# Patient Record
Sex: Male | Born: 1995 | Race: White | Hispanic: No | Marital: Single | Smoking: Never smoker
Health system: Southern US, Community
[De-identification: ages and names within clinical notes are randomized; demographics above are authoritative.]

---
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
## 2016-09-10 ENCOUNTER — Emergency Department: Payer: BLUE CROSS/BLUE SHIELD

## 2016-09-10 ENCOUNTER — Emergency Department
Admission: EM | Admit: 2016-09-10 | Discharge: 2016-09-10 | Disposition: A | Discharge status: Home / Self Care | Payer: BLUE CROSS/BLUE SHIELD | Attending: Emergency Medicine | Admitting: Emergency Medicine

## 2016-09-10 ENCOUNTER — Encounter: Payer: Self-pay | Admitting: *Deleted

## 2016-09-10 DIAGNOSIS — Y999 Unspecified external cause status: Secondary | ICD-10-CM | POA: Insufficient documentation

## 2016-09-10 DIAGNOSIS — Y929 Unspecified place or not applicable: Secondary | ICD-10-CM | POA: Insufficient documentation

## 2016-09-10 DIAGNOSIS — Y9389 Activity, other specified: Secondary | ICD-10-CM | POA: Diagnosis not present

## 2016-09-10 DIAGNOSIS — S62231A Other displaced fracture of base of first metacarpal bone, right hand, initial encounter for closed fracture: Secondary | ICD-10-CM

## 2016-09-10 DIAGNOSIS — W208XXA Other cause of strike by thrown, projected or falling object, initial encounter: Secondary | ICD-10-CM | POA: Diagnosis not present

## 2016-09-10 DIAGNOSIS — S62234A Other nondisplaced fracture of base of first metacarpal bone, right hand, initial encounter for closed fracture: Secondary | ICD-10-CM | POA: Diagnosis not present

## 2016-09-10 DIAGNOSIS — S6991XA Unspecified injury of right wrist, hand and finger(s), initial encounter: Secondary | ICD-10-CM | POA: Diagnosis present

## 2016-09-10 MED ORDER — OXYCODONE-ACETAMINOPHEN 5-325 MG PO TABS: 1.0000 | ORAL_TABLET | Freq: Four times a day (QID) | ORAL | 0 refills | Status: AC | PRN

## 2016-09-10 MED ORDER — IBUPROFEN 800 MG PO TABS: 800.0000 mg | ORAL_TABLET | Freq: Three times a day (TID) | ORAL | 0 refills | Status: AC | PRN

## 2016-09-10 NOTE — ED Notes (Signed)
Pt ambulatory to xray with steady gait noted.  

## 2016-09-10 NOTE — ED Notes (Signed)
Pt reports moving gym equipment this evening, lever coming down onto right thumb.  Pt with limited mobility of right hand, swelling noted at base of thumb.

## 2016-09-10 NOTE — Discharge Instructions (Signed)
Take pain medicine as directed. Continue ice for swelling and pain. Follow-up with the orthopedist next week for further evaluation.

## 2016-09-10 NOTE — ED Provider Notes (Signed)
[facility] Emergency Department Provider Note  ____________________________________________  Time seen: Approximately 9:52 PM  I have reviewed the triage vital signs and the nursing notes.   HISTORY  Chief Complaint Hand Pain    HPI Mathew Nguyen is a 20 y.o. male who dropped a heavy piece of equipment on his right thumb at the base.Marland Kitchen. Swelling and pain. He is right-handed.   No past medical history on file.  There are no active problems to display for this patient.   No past surgical history on file.  Current Outpatient Rx  . Order #: [id number] Class: Print  . Order #: [id number] Class: Print    Allergies Review of patient's allergies indicates no known allergies.  No family history on file.  Social History Social History  Substance Use Topics  . Smoking status: Never Smoker  . Smokeless tobacco: Never Used  . Alcohol use No    Review of Systems Constitutional: No fever/chills Eyes: No visual changes. ENT: No sore throat. Cardiovascular: Denies chest pain. Respiratory: Denies shortness of breath. Gastrointestinal: No abdominal pain.  No nausea, no vomiting.  No diarrhea.  No constipation. Genitourinary: Negative for dysuria. Musculoskeletal: Negative for back pain. Skin: Negative for rash. Neurological: Negative for headaches, focal weakness or numbness. 10-point ROS otherwise negative.  ____________________________________________   PHYSICAL EXAM:  VITAL SIGNS: ED Triage Vitals  Enc Vitals Group     BP 09/10/16 2014 138/74     Pulse Rate 09/10/16 2014 74     Resp 09/10/16 2014 18     Temp 09/10/16 2014 98.4 F (36.9 C)     Temp Source 09/10/16 2014 Oral     SpO2 09/10/16 2014 98 %     Weight 09/10/16 2016 163 lb (73.9 kg)     Height 09/10/16 2016 5\' 8"  (1.727 m)     Head Circumference --      Peak Flow --      Pain Score 09/10/16 2018 7     Pain Loc --      Pain Edu? --      Excl. in GC? --     Constitutional:  Alert and oriented. Well appearing and in no acute distress. Eyes: Conjunctivae are normal. Head: Atraumatic.  Neck:  Supple.   Cardiovascular: Normal rate, regular rhythm. Grossly normal heart sounds.  Good peripheral circulation. Respiratory: Normal respiratory effort.   Musculoskeletal: Nml ROM of upper and lower extremity joints;  Except right thumb with swelling at the base of the first and second medical carpal bone with tenderness. Motor and sensory intact. No open wound. Nontender over the wrist, and elbow. Neurologic:  Normal speech and language. No gross focal neurologic deficits are appreciated. No gait instability. Skin:  Skin is warm, dry and intact. No rash noted. Psychiatric: Mood and affect are normal. Speech and behavior are normal.  ____________________________________________   LABS (all labs ordered are listed, but only abnormal results are displayed)  Labs Reviewed - No data to display ____________________________________________  EKG   ____________________________________________  RADIOLOGY  CLINICAL DATA:  Dropped gym equipment on hand today. Thumb pain and swelling.  EXAM: RIGHT HAND - COMPLETE 3+ VIEW  COMPARISON:  RIGHT hand radiograph August 19, 2010  FINDINGS: Tiny bony fragment at base of first metacarpus with underlying suspected donor site. No dislocation. There is no evidence of arthropathy or other focal bone abnormality. Soft tissues are unremarkable.  IMPRESSION: Acute suspected tiny avulsion fracture base of first metacarpus.   Electronically Signed  By: Awilda Metroourtnay  Bloomer M.D.   On: 09/10/2016 20:52 ____________________________________________   PROCEDURES  Procedure(s) performed: None  Critical Care performed: No  ____________________________________________   INITIAL IMPRESSION / ASSESSMENT AND PLAN / ED COURSE  Pertinent labs & imaging results that were available during my care of the patient were reviewed  by me and considered in my medical decision making (see chart for details).  [age] year old who dropped a heavy object on his right thumb resulting in an avulsion fracture to the base of the first metacarpal bone. He is placed in a thumb spica splint, given pain medicine. Will follow-up with orthopedist next week. Continue ice for pain and swelling. ____________________________________________   FINAL CLINICAL IMPRESSION(S) / ED DIAGNOSES  Final diagnoses:  Closed nondisplaced fracture of base of first metacarpal bone of right hand, unspecified fracture morphology, initial encounter      Ignacia BayleyRobert Cree Kunert, PA-C 09/10/16 2155    Rockne MenghiniAnne-Caroline Norman, MD 09/10/16 2338

## 2016-09-10 NOTE — ED Triage Notes (Signed)
Pt has pain in right great thumb and hand.  Pt dropped gym equipment on hand today.  Swelling noted to thumb and hand. .Marland Kitchen

## 2016-11-15 IMAGING — CR DG HAND COMPLETE 3+V*R*
1 series · 3 of 3 positions shown · non-contrast
Comparison: RIGHT hand radiograph August 19, 2010

CLINICAL DATA: Dropped gym equipment on hand today. Thumb pain and
swelling.

EXAM:
RIGHT HAND - COMPLETE 3+ VIEW

[Series 1: x hand pa right · 0.14mm/px · 3 of 3 slices shown]
[im 1/3]
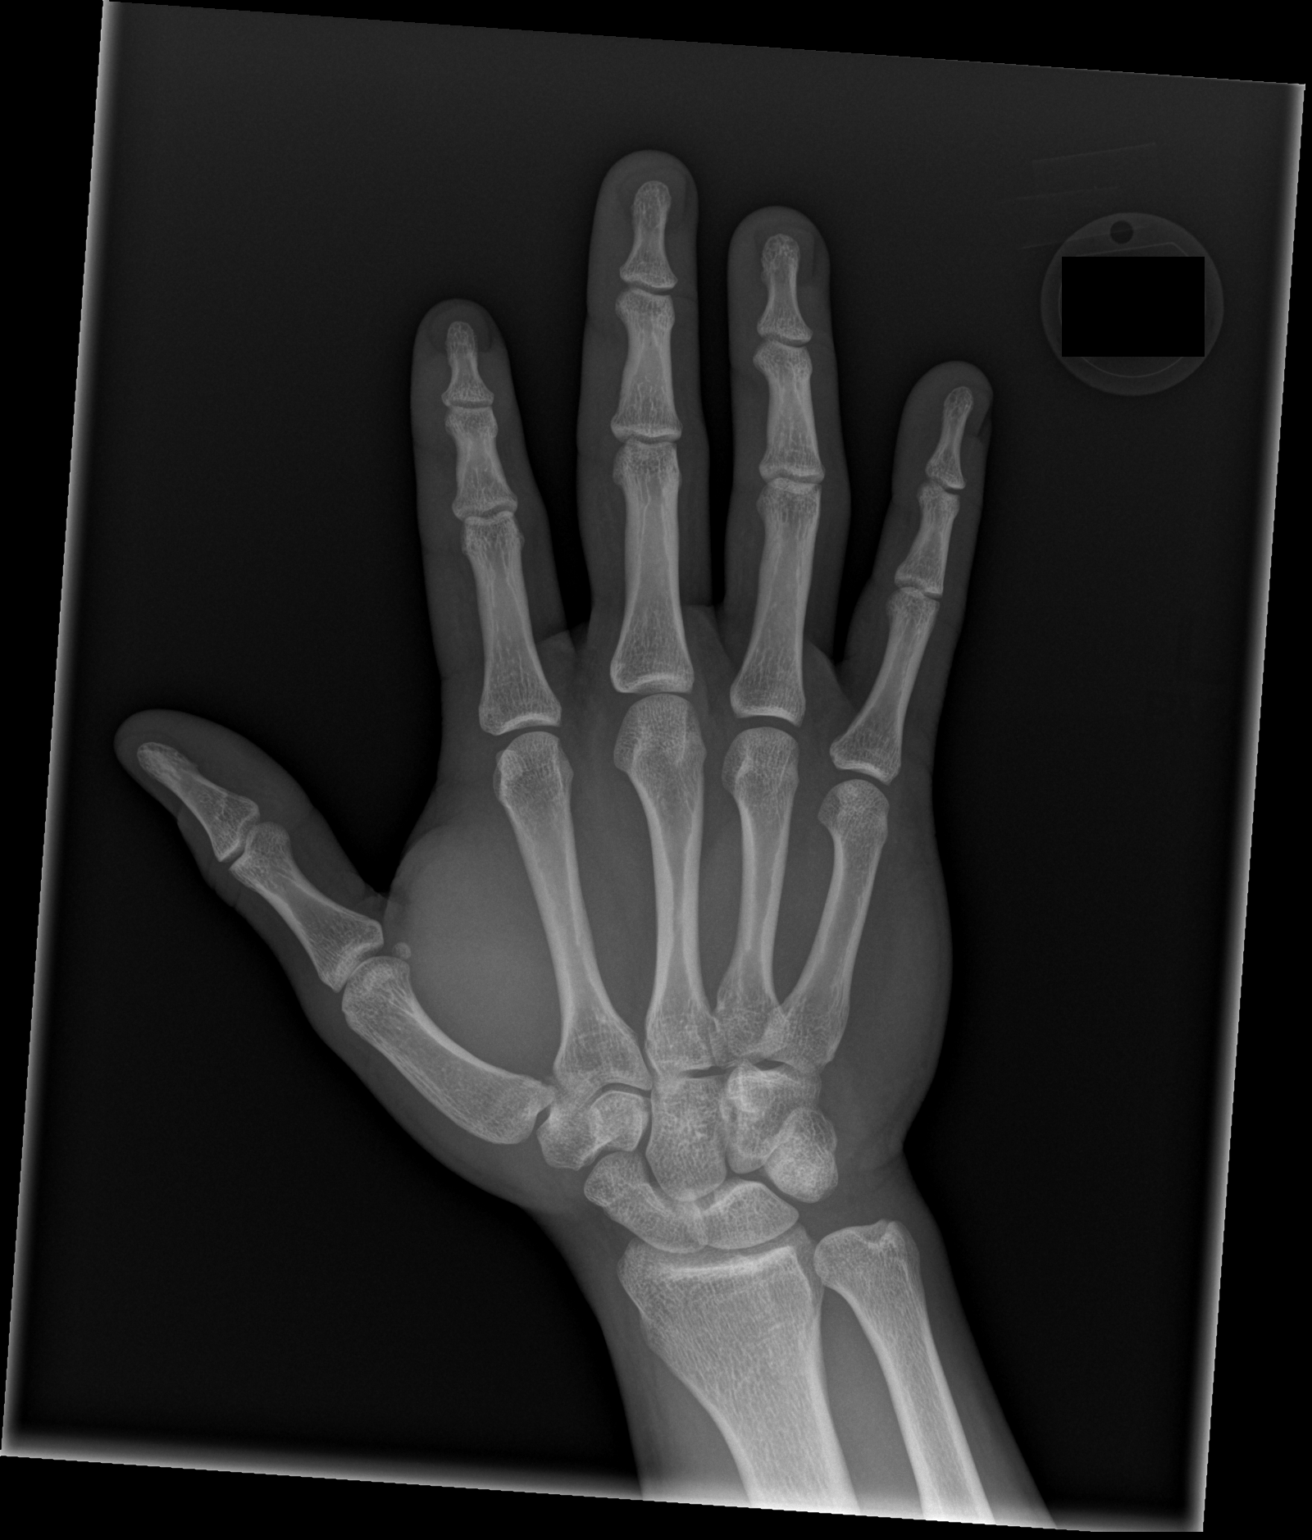
[im 2/3]
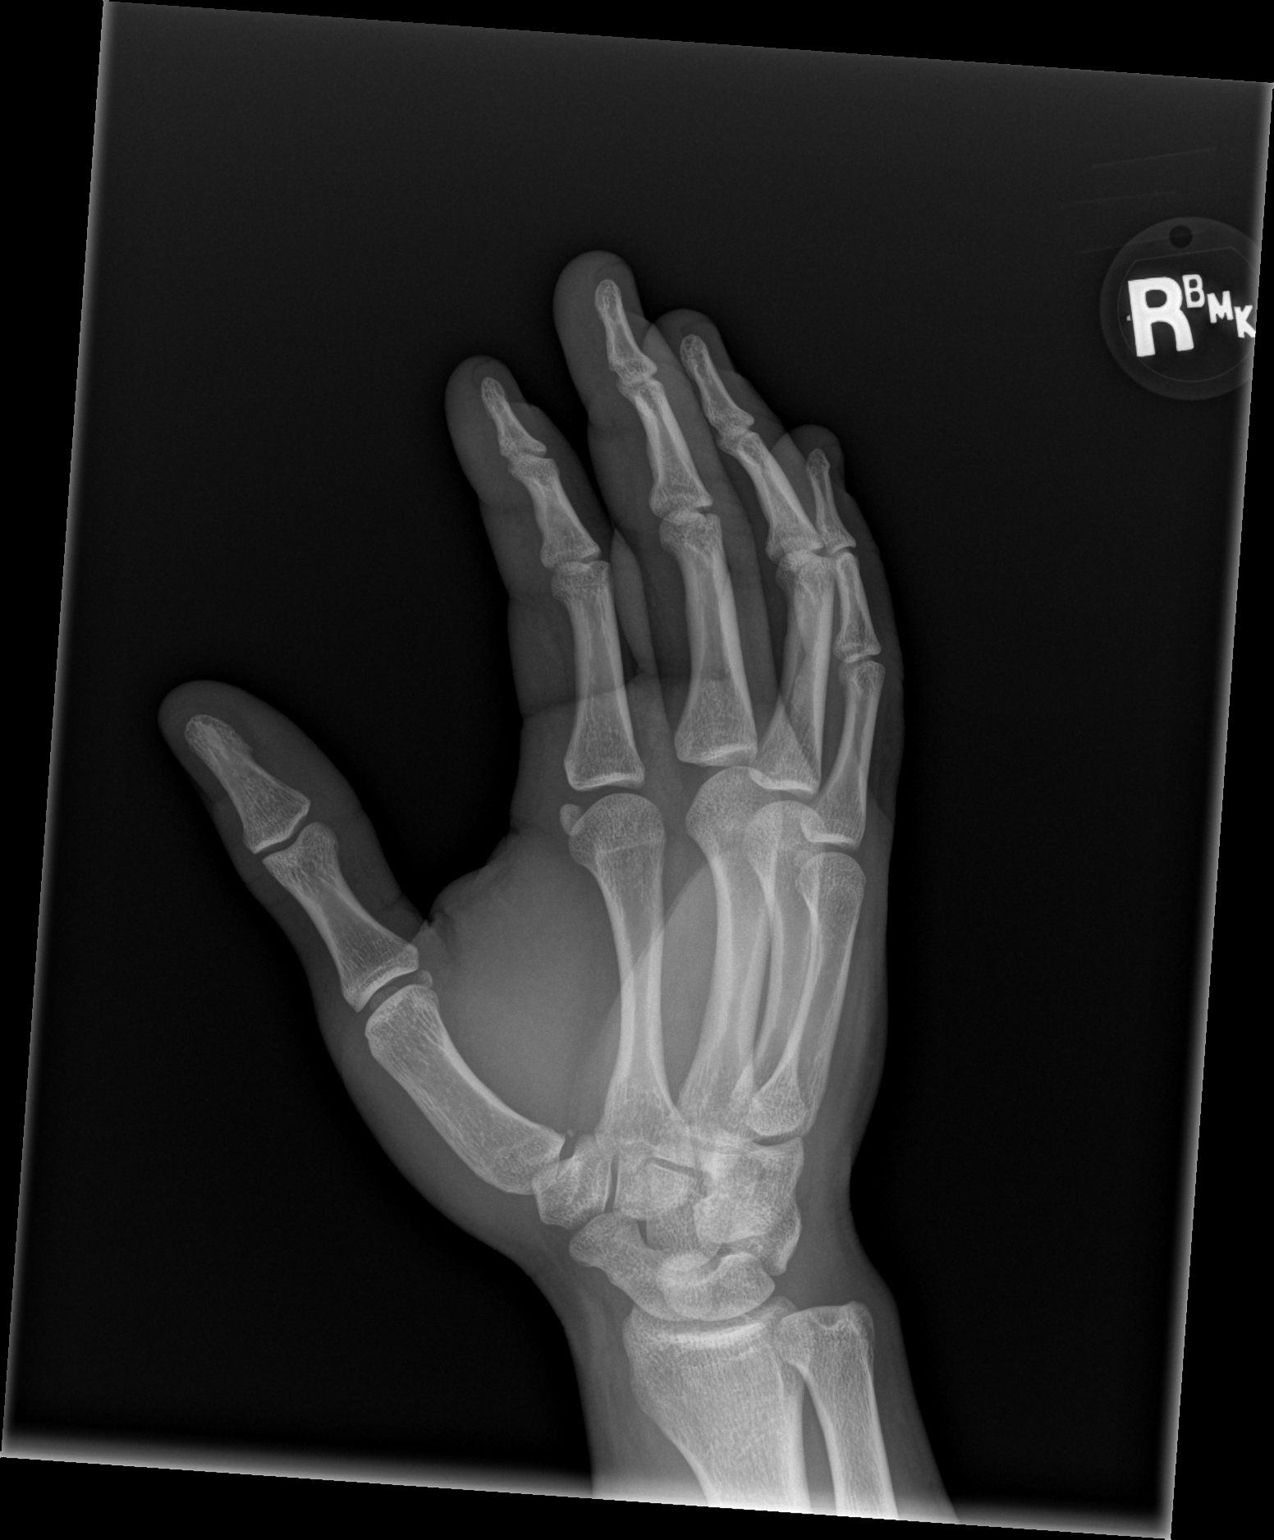
[im 3/3]
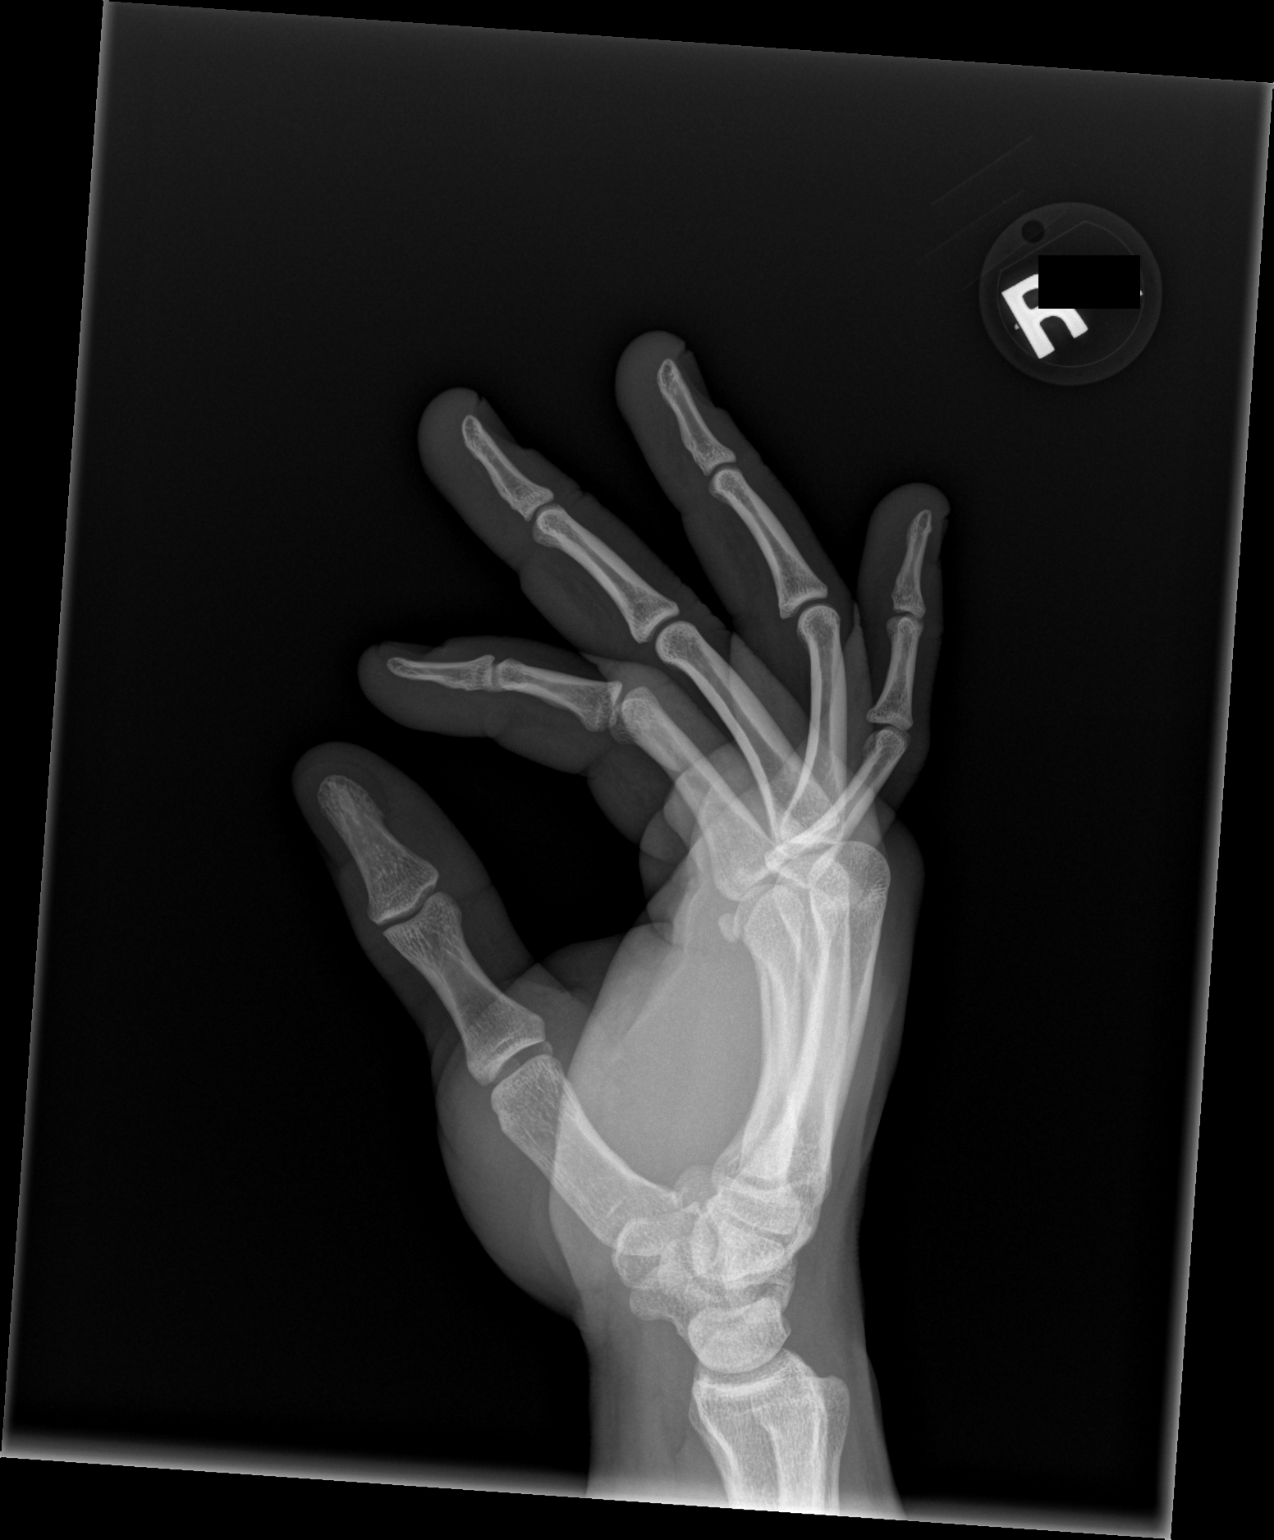

[3 of 3 positions shown; findings below may reference images not displayed]

FINDINGS: Tiny bony fragment at base of first metacarpus with underlying
suspected donor site. No dislocation. There is no evidence of
arthropathy or other focal bone abnormality. Soft tissues are
unremarkable.
IMPRESSION: Acute suspected tiny avulsion fracture base of first metacarpus.

## 2019-10-26 ENCOUNTER — Other Ambulatory Visit: Payer: BLUE CROSS/BLUE SHIELD

## 2020-02-28 ENCOUNTER — Ambulatory Visit: Payer: Self-pay

## 2023-12-11 ENCOUNTER — Ambulatory Visit
Admission: EM | Admit: 2023-12-11 | Discharge: 2023-12-11 | Disposition: A | Discharge status: Home / Self Care | Payer: Managed Care, Other (non HMO) | Attending: Nurse Practitioner | Admitting: Nurse Practitioner

## 2023-12-11 DIAGNOSIS — J101 Influenza due to other identified influenza virus with other respiratory manifestations: Secondary | ICD-10-CM

## 2023-12-11 LAB — POC COVID19/FLU A&B COMBO
Covid Antigen, POC: NEGATIVE
Influenza A Antigen, POC: POSITIVE — AB
Influenza B Antigen, POC: NEGATIVE

## 2023-12-11 MED ORDER — OSELTAMIVIR PHOSPHATE 75 MG PO CAPS: 75.0000 mg | ORAL_CAPSULE | Freq: Two times a day (BID) | ORAL | 0 refills | Status: AC

## 2023-12-11 MED ORDER — PROMETHAZINE-DM 6.25-15 MG/5ML PO SYRP: 5.0000 mL | ORAL_SOLUTION | Freq: Four times a day (QID) | ORAL | 0 refills | Status: AC | PRN

## 2023-12-11 NOTE — Discharge Instructions (Signed)
You have tested positive for influenza A. Take medication as prescribed. Increase fluids and allow for plenty of rest. May take over-the-counter Tylenol or ibuprofen as needed for pain, fever, or general discomfort. Recommend use of a humidifier at nighttime during sleep and sleeping elevated on pillows while cough symptoms persist. You should remain home until you have been fever free for 24 hours with no medication. Please be advised that symptoms can last anywhere from 5 to 7 days.  If symptoms extend beyond that time, or worsen, you may follow-up in this clinic or with your primary care physician for further evaluation. Follow-up as needed.

## 2023-12-11 NOTE — ED Triage Notes (Signed)
Pt reports he has some body aches,fever, chills, and a cough x 1 day

## 2023-12-11 NOTE — ED Provider Notes (Signed)
[facility]    CSN: [id number] Arrival date & time: 12/11/23  0954      History   Chief Complaint Chief Complaint  Patient presents with   Generalized Body Aches    HPI Mathew Nguyen is a 28 y.o. male.   The history is provided by the patient.   Patient presents with a 1 day history of fevers, body aches, chills, and cough.  Tmax 100-101.  Denies headache, ear pain, sore throat, nasal congestion, runny nose, wheezing, difficulty breathing, chest pain, abdominal pain, nausea, vomiting, diarrhea, or rash.  Patient reports that he has been taking over-the-counter cough and cold medications for his symptoms.  Reports several of his coworkers have been sick.  History reviewed. No pertinent past medical history.  There are no active problems to display for this patient.   History reviewed. No pertinent surgical history.     Home Medications    Prior to Admission medications   Medication Sig Start Date End Date Taking? Authorizing Provider  oseltamivir (TAMIFLU) 75 MG capsule Take 1 capsule (75 mg total) by mouth every 12 (twelve) hours. 12/11/23  Yes Leath-Warren, Sadie Haber, NP  promethazine-dextromethorphan (PROMETHAZINE-DM) 6.25-15 MG/5ML syrup Take 5 mLs by mouth 4 (four) times daily as needed. 12/11/23  Yes Leath-Warren, Sadie Haber, NP  ibuprofen (ADVIL,MOTRIN) 800 MG tablet Take 1 tablet (800 mg total) by mouth every 8 (eight) hours as needed. 09/10/16   Ignacia Bayley, PA-C  oxyCODONE-acetaminophen (ROXICET) 5-325 MG tablet Take 1 tablet by mouth every 6 (six) hours as needed. 09/10/16   Ignacia Bayley, PA-C    Family History History reviewed. No pertinent family history.  Social History Social History   Tobacco Use   Smoking status: Never   Smokeless tobacco: Never  Vaping Use   Vaping status: Never Used  Substance Use Topics   Alcohol use: No     Allergies   Patient has no known allergies.   Review of Systems Review of Systems Per  HPI  Physical Exam Triage Vital Signs ED Triage Vitals [12/11/23 1112]  Encounter Vitals Group     BP 136/66     Systolic BP Percentile      Diastolic BP Percentile      Pulse Rate (!) 102     Resp 18     Temp 98.3 F (36.8 C)     Temp Source Oral     SpO2 98 %     Weight      Height      Head Circumference      Peak Flow      Pain Score 4     Pain Loc      Pain Education      Exclude from Growth Chart    No data found.  Updated Vital Signs BP 136/66 (BP Location: Right Arm)   Pulse (!) 102   Temp 98.3 F (36.8 C) (Oral)   Resp 18   SpO2 98%   Visual Acuity Right Eye Distance:   Left Eye Distance:   Bilateral Distance:    Right Eye Near:   Left Eye Near:    Bilateral Near:     Physical Exam Vitals and nursing note reviewed.  Constitutional:      General: He is not in acute distress.    Appearance: Normal appearance.  HENT:     Head: Normocephalic.     Right Ear: Tympanic membrane, ear canal and external ear normal.     Left  Ear: Tympanic membrane, ear canal and external ear normal.     Nose: Congestion present.     Right Turbinates: Enlarged and swollen.     Left Turbinates: Enlarged and swollen.     Right Sinus: No maxillary sinus tenderness or frontal sinus tenderness.     Left Sinus: No maxillary sinus tenderness or frontal sinus tenderness.     Mouth/Throat:     Lips: Pink.     Mouth: Mucous membranes are moist.     Pharynx: Uvula midline. Postnasal drip present. No oropharyngeal exudate or uvula swelling.  Eyes:     Extraocular Movements: Extraocular movements intact.     Conjunctiva/sclera: Conjunctivae normal.     Pupils: Pupils are equal, round, and reactive to light.  Cardiovascular:     Rate and Rhythm: Regular rhythm. Tachycardia present.     Pulses: Normal pulses.     Heart sounds: Normal heart sounds.  Pulmonary:     Effort: Pulmonary effort is normal. No respiratory distress.     Breath sounds: Normal breath sounds. No stridor. No  wheezing, rhonchi or rales.  Abdominal:     General: Bowel sounds are normal.     Palpations: Abdomen is soft.     Tenderness: There is no abdominal tenderness.  Musculoskeletal:     Cervical back: Normal range of motion.  Lymphadenopathy:     Cervical: No cervical adenopathy.  Skin:    General: Skin is warm and dry.  Neurological:     General: No focal deficit present.     Mental Status: He is alert and oriented to person, place, and time.  Psychiatric:        Mood and Affect: Mood normal.        Behavior: Behavior normal.      UC Treatments / Results  Labs (all labs ordered are listed, but only abnormal results are displayed) Labs Reviewed  POC COVID19/FLU A&B COMBO - Abnormal; Notable for the following components:      Result Value   Influenza A Antigen, POC Positive (*)    All other components within normal limits    EKG   Radiology No results found.  Procedures Procedures (including critical care time)  Medications Ordered in UC Medications - No data to display  Initial Impression / Assessment and Plan / UC Course  I have reviewed the triage vital signs and the nursing notes.  Pertinent labs & imaging results that were available during my care of the patient were reviewed by me and considered in my medical decision making (see chart for details).  COVID/flu test is positive for influenza A.  Will start patient on Tamiflu 75 mg twice daily for the next 5 days.  Symptomatic treatment provided with Promethazine DM for his cough.  Supportive care recommendations were provided and discussed with the patient to include fluids, rest, over-the-counter analgesics, and use of a humidifier at nighttime during sleep.  Discussed indications with the patient regarding follow-up.  Patient was in agreement with this plan of care and verbalizes understanding.  All questions were answered.  Patient stable for discharge.  Work note was provided.   Final Clinical Impressions(s) /  UC Diagnoses   Final diagnoses:  Influenza A     Discharge Instructions      You have tested positive for influenza A. Take medication as prescribed. Increase fluids and allow for plenty of rest. May take over-the-counter Tylenol or ibuprofen as needed for pain, fever, or general discomfort. Recommend use of  a humidifier at nighttime during sleep and sleeping elevated on pillows while cough symptoms persist. You should remain home until you have been fever free for 24 hours with no medication. Please be advised that symptoms can last anywhere from 5 to 7 days.  If symptoms extend beyond that time, or worsen, you may follow-up in this clinic or with your primary care physician for further evaluation. Follow-up as needed.     ED Prescriptions     Medication Sig Dispense Auth. Provider   promethazine-dextromethorphan (PROMETHAZINE-DM) 6.25-15 MG/5ML syrup Take 5 mLs by mouth 4 (four) times daily as needed. 118 mL Leath-Warren, Sadie Haber, NP   oseltamivir (TAMIFLU) 75 MG capsule Take 1 capsule (75 mg total) by mouth every 12 (twelve) hours. 10 capsule Leath-Warren, Sadie Haber, NP      PDMP not reviewed this encounter.   Abran Cantor, NP 12/11/23 1139

## 2024-02-02 ENCOUNTER — Ambulatory Visit: Payer: Self-pay

## 2024-02-02 NOTE — Telephone Encounter (Signed)
 "  Call cannot be completed as dialed"

## 2024-02-02 NOTE — Telephone Encounter (Signed)
 Attempt 3 made. Unable to reach pt. "Call can not be completed as dialed."

## 2024-02-02 NOTE — Telephone Encounter (Signed)
 1st attempt - attempted to call back for triage. Received "call cannot be completed as dialed". Will continue to attempt.   Copied from CRM [id number]. Topic: Clinical - Red Word Triage >> Feb 02, 2024  1:22 PM Marlow Baars wrote: Red Word that prompted transfer to Nurse Triage: The patient who is a non established patient called in stating he has a very swollen throat and he says it was white and he also sees red spots. The patient also complains of a fever and dry cough. I will transfer the patient to [id number]. Before I could transfer the patient he hung up. Please assist patient further
